# Patient Record
Sex: Female | Born: 1990 | Race: White | Hispanic: No | Marital: Single | Smoking: Current some day smoker
Health system: Southern US, Community
[De-identification: ages and names within clinical notes are randomized; demographics above are authoritative.]

## PROBLEM LIST (undated history)

## (undated) DIAGNOSIS — L409 Psoriasis, unspecified: Secondary | ICD-10-CM

---
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
## 2013-10-11 ENCOUNTER — Emergency Department (INDEPENDENT_AMBULATORY_CARE_PROVIDER_SITE_OTHER): Payer: Commercial Managed Care - PPO

## 2013-10-11 ENCOUNTER — Emergency Department (HOSPITAL_COMMUNITY)
Admission: EM | Admit: 2013-10-11 | Discharge: 2013-10-11 | Disposition: A | Discharge status: Home / Self Care | Payer: Commercial Managed Care - PPO | Source: Home / Self Care | Attending: Emergency Medicine | Admitting: Emergency Medicine

## 2013-10-11 ENCOUNTER — Encounter (HOSPITAL_COMMUNITY): Payer: Self-pay | Admitting: Emergency Medicine

## 2013-10-11 DIAGNOSIS — W108XXA Fall (on) (from) other stairs and steps, initial encounter: Secondary | ICD-10-CM

## 2013-10-11 DIAGNOSIS — Y929 Unspecified place or not applicable: Secondary | ICD-10-CM

## 2013-10-11 DIAGNOSIS — X58XXXA Exposure to other specified factors, initial encounter: Secondary | ICD-10-CM

## 2013-10-11 DIAGNOSIS — S93409A Sprain of unspecified ligament of unspecified ankle, initial encounter: Secondary | ICD-10-CM

## 2013-10-11 MED ORDER — HYDROCODONE-ACETAMINOPHEN 5-325 MG PO TABS: 1.0000 | ORAL_TABLET | ORAL | Status: AC | PRN

## 2013-10-11 MED ORDER — NAPROXEN 500 MG PO TABS: 500.0000 mg | ORAL_TABLET | Freq: Two times a day (BID) | ORAL | Status: AC

## 2013-10-11 NOTE — Discharge Instructions (Signed)
Acute Ankle Sprain  with Phase I Rehab  An acute ankle sprain is a partial or complete tear in one or more of the ligaments of the ankle due to traumatic injury. The severity of the injury depends on both the the number of ligaments sprained and the grade of sprain. There are 3 grades of sprains.   · A grade 1 sprain is a mild sprain. There is a slight pull without obvious tearing. There is no loss of strength, and the muscle and ligament are the correct length.  · A grade 2 sprain is a moderate sprain. There is tearing of fibers within the substance of the ligament where it connects two bones or two cartilages. The length of the ligament is increased, and there is usually decreased strength.  · A grade 3 sprain is a complete rupture of the ligament and is uncommon.  In addition to the grade of sprain, there are three types of ankle sprains.   Lateral ankle sprains: This is a sprain of one or more of the three ligaments on the outer side (lateral) of the ankle. These are the most common sprains.  Medial ankle sprains: There is one large triangular ligament of the inner side (medial) of the ankle that is susceptible to injury. Medial ankle sprains are less common.  Syndesmosis, "high ankle," sprains: The syndesmosis is the ligament that connects the two bones of the lower leg. Syndesmosis sprains usually only occur with very severe ankle sprains.  SYMPTOMS  · Pain, tenderness, and swelling in the ankle, starting at the side of injury that may progress to the whole ankle and foot with time.  · "Pop" or tearing sensation at the time of injury.  · Bruising that may spread to the heel.  · Impaired ability to walk soon after injury.  CAUSES   · Acute ankle sprains are caused by trauma placed on the ankle that temporarily forces or pries the anklebone (talus) out of its normal socket.  · Stretching or tearing of the ligaments that normally hold the joint in place (usually due to a twisting injury).  RISK INCREASES  WITH:  · Previous ankle sprain.  · Sports in which the foot may land awkwardly (ie. basketball, volleyball, or soccer) or walking or running on uneven or rough surfaces.  · Shoes with inadequate support to prevent sideways motion when stress occurs.  · Poor strength and flexibility.  · Poor balance skills.  · Contact sports.  PREVENTION   · Warm up and stretch properly before activity.  · Maintain physical fitness:  · Ankle and leg flexibility, muscle strength, and endurance.  · Cardiovascular fitness.  · Balance training activities.  · Use proper technique and have a coach correct improper technique.  · Taping, protective strapping, bracing, or high-top tennis shoes may help prevent injury. Initially, tape is best; however, it loses most of its support function within 10 to 15 minutes.  · Wear proper fitted protective shoes (High-top shoes with taping or bracing is more effective than either alone).  · Provide the ankle with support during sports and practice activities for 12 months following injury.  PROGNOSIS   · If treated properly, ankle sprains can be expected to recover completely; however, the length of recovery depends on the degree of injury.  · A grade 1 sprain usually heals enough in 5 to 7 days to allow modified activity and requires an average of 6 weeks to heal completely.  · A grade 2 sprain requires   6 to 10 weeks to heal completely.  · A grade 3 sprain requires 12 to 16 weeks to heal.  · A syndesmosis sprain often takes more than 3 months to heal.  RELATED COMPLICATIONS   · Frequent recurrence of symptoms may result in a chronic problem. Appropriately addressing the problem the first time decreases the frequency of recurrence and optimizes healing time. Severity of the initial sprain does not predict the likelihood of later instability.  · Injury to other structures (bone, cartilage, or tendon).  · A chronically unstable or arthritic ankle joint is a possiblity with repeated  sprains.  TREATMENT  Treatment initially involves the use of ice, medication, and compression bandages to help reduce pain and inflammation. Ankle sprains are usually immobilized in a walking cast or boot to allow for healing. Crutches may be recommended to reduce pressure on the injury. After immobilization, strengthening and stretching exercises may be necessary to regain strength and a full range of motion. Surgery is rarely needed to treat ankle sprains.  MEDICATION   · Nonsteroidal anti-inflammatory medications, such as aspirin and ibuprofen (do not take for the first 3 days after injury or within 7 days before surgery), or other minor pain relievers, such as acetaminophen, are often recommended. Take these as directed by your caregiver. Contact your caregiver immediately if any bleeding, stomach upset, or signs of an allergic reaction occur from these medications.  · Ointments applied to the skin may be helpful.  · Pain relievers may be prescribed as necessary by your caregiver. Do not take prescription pain medication for longer than 4 to 7 days. Use only as directed and only as much as you need.  HEAT AND COLD  · Cold treatment (icing) is used to relieve pain and reduce inflammation for acute and chronic cases. Cold should be applied for 10 to 15 minutes every 2 to 3 hours for inflammation and pain and immediately after any activity that aggravates your symptoms. Use ice packs or an ice massage.  · Heat treatment may be used before performing stretching and strengthening activities prescribed by your caregiver. Use a heat pack or a warm soak.  SEEK IMMEDIATE MEDICAL CARE IF:   · Pain, swelling, or bruising worsens despite treatment.  · You experience pain, numbness, discoloration, or coldness in the foot or toes.  · New, unexplained symptoms develop (drugs used in treatment may produce side effects.)  EXERCISES   PHASE I EXERCISES  RANGE OF MOTION (ROM) AND STRETCHING EXERCISES - Ankle Sprain, Acute Phase I,  Weeks 1 to 2  These exercises may help you when beginning to restore flexibility in your ankle. You will likely work on these exercises for the 1 to 2 weeks after your injury. Once your physician, physical therapist, or athletic trainer sees adequate progress, he or she will advance your exercises. While completing these exercises, remember:   · Restoring tissue flexibility helps normal motion to return to the joints. This allows healthier, less painful movement and activity.  · An effective stretch should be held for at least 30 seconds.  · A stretch should never be painful. You should only feel a gentle lengthening or release in the stretched tissue.  RANGE OF MOTION - Dorsi/Plantar Flexion  · While sitting with your right / left knee straight, draw the top of your foot upwards by flexing your ankle. Then reverse the motion, pointing your toes downward.  · Hold each position for __________ seconds.  · After completing your first set of   exercises, repeat this exercise with your knee bent.  Repeat __________ times. Complete this exercise __________ times per day.   RANGE OF MOTION - Ankle Alphabet  · Imagine your right / left big toe is a pen.  · Keeping your hip and knee still, write out the entire alphabet with your "pen." Make the letters as large as you can without increasing any discomfort.  Repeat __________ times. Complete this exercise __________ times per day.   STRENGTHENING EXERCISES - Ankle Sprain, Acute -Phase I, Weeks 1 to 2  These exercises may help you when beginning to restore strength in your ankle. You will likely work on these exercises for 1 to 2 weeks after your injury. Once your physician, physical therapist, or athletic trainer sees adequate progress, he or she will advance your exercises. While completing these exercises, remember:   · Muscles can gain both the endurance and the strength needed for everyday activities through controlled exercises.  · Complete these exercises as instructed by  your physician, physical therapist, or athletic trainer. Progress the resistance and repetitions only as guided.  · You may experience muscle soreness or fatigue, but the pain or discomfort you are trying to eliminate should never worsen during these exercises. If this pain does worsen, stop and make certain you are following the directions exactly. If the pain is still present after adjustments, discontinue the exercise until you can discuss the trouble with your clinician.  STRENGTH - Dorsiflexors  · Secure a rubber exercise band/tubing to a fixed object (ie. table, pole) and loop the other end around your right / left foot.  · Sit on the floor facing the fixed object. The band/tubing should be slightly tense when your foot is relaxed.  · Slowly draw your foot back toward you using your ankle and toes.  · Hold this position for __________ seconds. Slowly release the tension in the band and return your foot to the starting position.  Repeat __________ times. Complete this exercise __________ times per day.   STRENGTH - Plantar-flexors   · Sit with your right / left leg extended. Holding onto both ends of a rubber exercise band/tubing, loop it around the ball of your foot. Keep a slight tension in the band.  · Slowly push your toes away from you, pointing them downward.  · Hold this position for __________ seconds. Return slowly, controlling the tension in the band/tubing.  Repeat __________ times. Complete this exercise __________ times per day.   STRENGTH - Ankle Eversion  · Secure one end of a rubber exercise band/tubing to a fixed object (table, pole). Loop the other end around your foot just before your toes.  · Place your fists between your knees. This will focus your strengthening at your ankle.  · Drawing the band/tubing across your opposite foot, slowly, pull your little toe out and up. Make sure the band/tubing is positioned to resist the entire motion.  · Hold this position for __________ seconds.  Have  your muscles resist the band/tubing as it slowly pulls your foot back to the starting position.   Repeat __________ times. Complete this exercise __________ times per day.   STRENGTH - Ankle Inversion  · Secure one end of a rubber exercise band/tubing to a fixed object (table, pole). Loop the other end around your foot just before your toes.  · Place your fists between your knees. This will focus your strengthening at your ankle.  · Slowly, pull your big toe up and in, making   sure the band/tubing is positioned to resist the entire motion.  · Hold this position for __________ seconds.  · Have your muscles resist the band/tubing as it slowly pulls your foot back to the starting position.  Repeat __________ times. Complete this exercises __________ times per day.   STRENGTH - Towel Curls  · Sit in a chair positioned on a non-carpeted surface.  · Place your right / left foot on a towel, keeping your heel on the floor.  · Pull the towel toward your heel by only curling your toes. Keep your heel on the floor.  · If instructed by your physician, physical therapist, or athletic trainer, add weight to the end of the towel.  Repeat __________ times. Complete this exercise __________ times per day.  Document Released: 01/16/2005 Document Revised: 09/09/2011 Document Reviewed: 09/29/2008  [employer]® Patient Information ©2014 [employer].    Acute Ankle Sprain  with Phase II Rehab  An acute ankle sprain is a partial or complete tear in one or more of the ligaments of the ankle due to traumatic injury. The severity of the injury depends on both the number of ligaments sprained and the grade of sprain. There are 3 grades of sprains.  · A grade 1 sprain is a mild sprain. There is a slight pull without obvious tearing. There is no loss of strength, and the muscle and ligament are the correct length.  · A grade 2 sprain is a moderate sprain. There is tearing of fibers within the substance of the ligament where it connects two bones  or two cartilages. The length of the ligament is increased, and there is usually decreased strength.  · A grade 3 sprain is a complete rupture of the ligament and is uncommon.  In addition to the grade of sprain, there are 3 types of ankle sprains.   Lateral ankle sprains. This is a sprain of one or more of the 3 ligaments on the outer side (lateral) of the ankle. These are the most common sprains.  Medial ankle sprains. There is one large triangular ligament on the inner side (medial) of the ankle that is susceptible to injury. Medial ankle sprains are less common.  Syndesmosis, "high ankle," sprains. The syndesmosis is the ligament that connects the two bones of the lower leg. Syndesmosis sprains usually only occur with very severe ankle sprains.  SYMPTOMS  · Pain, tenderness, and swelling in the ankle, starting at the side of injury that may progress to the whole ankle and foot with time.  · "Pop" or tearing sensation at the time of injury.  · Bruising that may spread to the heel.  · Impaired ability to walk soon after injury.  CAUSES   · Acute ankle sprains are caused by trauma placed on the ankle that temporarily forces or pries the anklebone (talus) out of its normal socket.  · Stretching or tearing of the ligaments that normally hold the joint in place (usually due to a twisting injury).  RISK INCREASES WITH:  · Previous ankle sprain.  · Sports in which the foot may land awkwardly (basketball, volleyball, soccer) or walking or running on uneven or rough surfaces.  · Shoes with inadequate support to prevent sideways motion when stress occurs.  · Poor strength and flexibility.  · Poor balance skills.  · Contact sports.  PREVENTION  · Warm up and stretch properly before activity.  · Maintain physical fitness:  · Ankle and leg flexibility, muscle strength, and endurance.  · Cardiovascular   fitness.  · Balance training activities.  · Use proper technique and have a coach correct improper technique.  · Taping,  protective strapping, bracing, or high-top tennis shoes may help prevent injury. Initially, tape is best. However, it loses most of its support function within 10 to 15 minutes.  · Wear proper fitted protective shoes. Combining high-top shoes with taping or bracing is more effective than using either alone.  · Provide the ankle with support during sports and practice activities for 12 months following injury.  PROGNOSIS   · If treated properly, ankle sprains can be expected to recover completely. However, the length of recovery depends on the degree of injury.  · A grade 1 sprain usually heals enough in 5 to 7 days to allow modified activity and requires an average of 6 weeks to heal completely.  · A grade 2 sprain requires 6 to 10 weeks to heal completely.  · A grade 3 sprain requires 12 to 16 weeks to heal.  · A syndesmosis sprain often takes more than 3 months to heal.  RELATED COMPLICATIONS   · Frequent recurrence of symptoms may result in a chronic problem. Appropriately addressing the problem the first time decreases the frequency of recurrence and optimizes healing time. Severity of initial sprain does not predict the likelihood of later instability.  · Injury to other structures (bone, cartilage, or tendon).  · Chronically unstable or arthritic ankle joint are possible with repeated sprains.  TREATMENT  Treatment initially involves the use of ice, medicine, and compression bandages to help reduce pain and inflammation. Ankle sprains are usually immobilized in a walking cast or boot to allow for healing. Crutches may be recommended to reduce pressure on the injury. After immobilization, strengthening and stretching exercises may be necessary to regain strength and a full range of motion. Surgery is rarely needed to treat ankle sprains.  MEDICATION   · Nonsteroidal anti-inflammatory medicines, such as aspirin and ibuprofen (do not take for the first 3 days after injury or within 7 days before surgery), or  other minor pain relievers, such as acetaminophen, are often recommended. Take these as directed by your caregiver. Contact your caregiver immediately if any bleeding, stomach upset, or signs of an allergic reaction occur from these medicines.  · Ointments applied to the skin may be helpful.  · Pain relievers may be prescribed as necessary by your caregiver. Do not take prescription pain medicine for longer than 4 to 7 days. Use only as directed and only as much as you need.  HEAT AND COLD  · Cold treatment (icing) is used to relieve pain and reduce inflammation for acute and chronic cases. Cold should be applied for 10 to 15 minutes every 2 to 3 hours for inflammation and pain and immediately after any activity that aggravates your symptoms. Use ice packs or an ice massage.  · Heat treatment may be used before performing stretching and strengthening activities prescribed by your caregiver. Use a heat pack or a warm soak.  SEEK IMMEDIATE MEDICAL CARE IF:   · Pain, swelling, or bruising worsens despite treatment.  · You experience pain, numbness, discoloration, or coldness in the foot or toes.  · New, unexplained symptoms develop. (Drugs used in treatment may produce side effects.)  EXERCISES   PHASE II EXERCISES  RANGE OF MOTION (ROM) AND STRETCHING EXERCISES - Ankle Sprain, Acute-Phase II, Weeks 3 to 4  After your physician, physical therapist, or athletic trainer feels your knee has made progress significant enough   to begin more advanced exercises, he or she may recommend completing some of the following exercises. Although each person heals at different rates, most people will be ready for these exercises between 3 and 4 weeks after their injury. Do not begin these exercises until you have your caregiver's permission. He or she may also advise you to continue with the exercises which you completed in Phase I of your rehabilitation. While completing these exercises, remember:   · Restoring tissue flexibility helps  normal motion to return to the joints. This allows healthier, less painful movement and activity.  · An effective stretch should be held for at least 30 seconds.  · A stretch should never be painful. You should only feel a gentle lengthening or release in the stretched tissue.  RANGE OF MOTION - Ankle Plantar Flexion   · Sit with your right / left leg crossed over your opposite knee.  · Use your opposite hand to pull the top of your foot and toes toward you.  · You should feel a gentle stretch on the top of your foot/ankle. Hold this position for __________.  Repeat __________ times. Complete __________ times per day.   RANGE OF MOTION - Ankle Eversion  · Sit with your right / left ankle crossed over your opposite knee.  · Grip your foot with your opposite hand, placing your thumb on the top of your foot and your fingers across the bottom of your foot.  · Gently push your foot downward with a slight rotation so your littlest toes rise slightly  · You should feel a gentle stretch on the inside of your ankle. Hold the stretch for __________ seconds.  Repeat __________ times. Complete this exercise __________ times per day.   RANGE OF MOTION - Ankle Inversion  · Sit with your right / left ankle crossed over your opposite knee.  · Grip your foot with your opposite hand, placing your thumb on the bottom of your foot and your fingers across the top of your foot.  · Gently pull your foot so the smallest toe comes toward you and your thumb pushes the inside of the ball of your foot away from you.  · You should feel a gentle stretch on the outside of your ankle. Hold the stretch for __________ seconds.  Repeat __________ times. Complete this exercise __________ times per day.   STRETCH - Gastrocsoleus  · Sit with your right / left leg extended. Holding onto both ends of a belt or towel, loop it around the ball of your foot.  · Keeping your right / left ankle and foot relaxed and your knee straight, pull your foot and ankle  toward you using the belt/towel.  · You should feel a gentle stretch behind your calf or knee. Hold this position for __________ seconds.  Repeat __________ times. Complete this stretch __________ times per day.   RANGE OF MOTION - Ankle Dorsiflexion, Active Assisted  · Remove shoes and sit on a chair that is preferably not on a carpeted surface.  · Place right / left foot under knee. Extend your opposite leg for support.  · Keeping your heel down, slide your right / left foot back toward the chair until you feel a stretch at your ankle or calf. If you do not feel a stretch, slide your bottom forward to the edge of the chair while still keeping your heel down.  · Hold this stretch for __________ seconds.  Repeat __________ times. Complete this stretch   __________ times per day.   STRETCH  Gastroc, Standing   · Place hands on wall.  · Extend right / left leg and place a folded washcloth under the arch of your foot for support. Keep the front knee somewhat bent.  · Slightly point your toes inward on your back foot.  · Keeping your right / left heel on the floor and your knee straight, shift your weight toward the wall, not allowing your back to arch.  · You should feel a gentle stretch in the calf. Hold this position for __________ seconds.  Repeat __________ times. Complete this stretch __________ times per day.  STRETCH  Soleus, Standing  · Place hands on wall.  · Extend right / left leg and place a folded washcloth under the arch of your foot for support. Keep the front knee somewhat bent.  · Slightly point your toes inward on your back foot.  · Keep your right / left heel on the floor, bend your back knee, and slightly shift your weight over the back leg so that you feel a gentle stretch deep in your back calf.  · Hold this position for __________ seconds.  Repeat __________ times. Complete this stretch __________ times per day.  STRETCH  Gastrocsoleus, Standing  Note: This exercise can place a lot of stress on  your foot and ankle. Please complete this exercise only if specifically instructed by your caregiver.   · Place the ball of your right / left foot on a step, keeping your other foot firmly on the same step.  · Hold on to the wall or a rail for balance.  · Slowly lift your other foot, allowing your body weight to press your heel down over the edge of the step.  · You should feel a stretch in your right / left calf.  · Hold this position for __________ seconds.  · Repeat this exercise with a slight bend in your knee.  Repeat __________ times. Complete this stretch __________ times per day.   STRENGTHENING EXERCISES - Ankle Sprain, Acute-Phase II  Around 3 to 4 weeks after your injury, you may progress to some of these exercises in your rehabilitation program. Do not begin these until you have your caregiver's permission. Although your condition has improved, the Phase I exercises will continue to be helpful and you may continue to complete them. As you complete strengthening exercises, remember:   · Strong muscles with good endurance tolerate stress better.  · Do the exercises as initially prescribed by your caregiver. Progress slowly with each exercise, gradually increasing the number of repetitions and weight used under his or her guidance.  · You may experience muscle soreness or fatigue, but the pain or discomfort you are trying to eliminate should never worsen during these exercises. If this pain does worsen, stop and make certain you are following the directions exactly. If the pain is still present after adjustments, discontinue the exercise until you can discuss the trouble with your caregiver.  STRENGTH - Plantar-flexors, Standing  · Stand with your feet shoulder width apart. Steady yourself with a wall or table using as little support as needed.  · Keeping your weight evenly spread over the width of your feet, rise up on your toes.*  · Hold this position for __________ seconds.  Repeat __________ times.  Complete this exercise __________ times per day.   *If this is too easy, shift your weight toward your right / left leg until you feel challenged. Ultimately,   you may be asked to do this exercise with your right / left foot only.  STRENGTH  Dorsiflexors and Plantar-flexors, Heel/toe Walking  · Dorsiflexion: Walk on your heels only. Keep your toes as high as possible.  · Walk for ____________________ seconds/feet.  · Repeat __________ times. Complete __________ times per day.  · Plantar flexion: Walk on your toes only. Keep your heels as high as possible.  · Walk for ____________________ seconds/feet.  Repeat __________ times. Complete __________ times per day.   BALANCE  Tandem Walking  · Place your uninjured foot on a line 2 to 4 inches wide and at least 10 feet long.  · Keeping your balance without using anything for extra support, place your right / left heel directly in front of your other foot.  · Slowly raise your back foot up, lifting from the heel to the toes, and place it directly in front of the right / left foot.  · Continue to walk along the line slowly. Walk for ____________________ feet.  Repeat ____________________ times. Complete ____________________ times per day.  BALANCE - Inversion/Eversion  Use caution, these are advanced level exercises. Do not begin them until you are advised to do so.   · Create a balance board using a sturdy board about 1 ½ feet long and at 1 to 1 ½ feet wide and a 1 ½ inch diameter rod or pipe that is as long as the board's width. A copper pipe or a solid broomstick work well.  · Stand on a non-carpeted surface near a countertop or wall. Step onto the board so that your feet are hip-width apart and equally straddle the rod/pipe.  · Keeping your feet in place, complete these two exercises without shifting your upper body or hips:  · Tip the board from side-to-side. Control the movement so the board does not forcefully strike the ground. The board should silently tap the  ground.  · Tip the board side-to-side without striking the ground. Occasionally pause and maintain a steady position at various points.  · Repeat the first two exercises, but use only your right / left foot. Place your right / left foot directly over the rod/pipe.  Repeat __________ times. Complete this exercise __________ times a day.  BALANCE - Plantar/Dorsi Flexion  Use caution, these are advanced level exercises. Do not begin them until you are advised to do so.   · Create a balance board using a sturdy board about 1 ½ feet long and at 1 to 1 ½ feet wide and a 1 ½ inch diameter rod or pipe that is as long as the board's width. A copper pipe or a solid broomstick work well.  · Stand on a non-carpeted surface near a countertop or wall. Stand on the board so that the rod/pipe runs under the arches in your feet.  · Keeping your feet in place, complete these two exercises without shifting your upper body or hips:  · Tip the board from side-to-side. Control the movement so the board does not forcefully strike the ground. The board should silently tap the ground.  · Tip the board side-to-side without striking the ground. Occasionally pause and maintain a steady position at various points.  · Repeat the first two exercises, but use only your right / left foot. Stand in the center of the board.  Repeat __________ times. Complete this exercise __________ times a day.  STRENGTH  Plantar-flexors, Eccentric  Note: This exercise can place a lot of stress on

## 2013-10-11 NOTE — ED Notes (Signed)
Reports falling down steps last night around 9 p.m injuring left ankle.  Having pain with walking and swelling.  No relief with advil or elevation.

## 2013-10-11 NOTE — ED Provider Notes (Signed)
Medical screening examination/treatment/procedure(s) were performed by non-physician practitioner and as supervising physician I was immediately available for consultation/collaboration.  Leslee Homeavid Tanishi Nault, M.D.  Reuben Likesavid C Wei Poplaski, MD 10/11/13 [phone]

## 2013-10-11 NOTE — ED Provider Notes (Signed)
CSN: [id number]     Arrival date & time 10/11/13  1058 History   First MD Initiated Contact with Patient 10/11/13 1237     Chief Complaint  Patient presents with  . Ankle Injury   (Consider location/radiation/quality/duration/timing/severity/associated sxs/prior Treatment) HPI Comments: 23 year old female presents complaining of left ankle injury. At 9 PM last night, she fell down some steps. She had immediate pain and swelling in the ankle. She has pain from the mid calf all the way down through the ankle in the foot. She also has subjective numbness in the foot. No other injuries. No previous history of ankle injuries.  Patient is a 23 y.o. female presenting with lower extremity injury.  Ankle Injury    Past Medical History  Diagnosis Date  . Asthma    History reviewed. No pertinent past surgical history. History reviewed. No pertinent family history. History  Substance Use Topics  . Smoking status: Never Smoker   . Smokeless tobacco: Never Used  . Alcohol Use: Yes   OB History   Grav Para Term Preterm Abortions TAB SAB Ect Mult Living                 Review of Systems  Musculoskeletal: Positive for arthralgias, gait problem and joint swelling.  Neurological: Negative for numbness.  All other systems reviewed and are negative.   Allergies  Review of patient's allergies indicates no known allergies.  Home Medications   Current Outpatient Rx  Name  Route  Sig  Dispense  Refill  . ADVAIR DISKUS 250-50 MCG/DOSE AEPB      inhale 1 puff by mouth twice a day   60 each   0     Pt needs OV for further refills   . HYDROcodone-acetaminophen (NORCO) 5-325 MG per tablet   Oral   Take 1 tablet by mouth every 4 (four) hours as needed for moderate pain.   20 tablet   0   . naproxen (NAPROSYN) 500 MG tablet   Oral   Take 1 tablet (500 mg total) by mouth 2 (two) times daily.   60 tablet   0    BP 131/76  Pulse 80  Temp(Src) 97.6 F (36.4 C) (Oral)  Resp 16  SpO2  100%  LMP 09/27/2013 Physical Exam  Nursing note and vitals reviewed. Constitutional: She is oriented to person, place, and time. Vital signs are normal. She appears well-developed and well-nourished. No distress.  HENT:  Head: Normocephalic and atraumatic.  Cardiovascular:  Capillary refill is less than 3 seconds in the bilateral lower extremities  Pulmonary/Chest: Effort normal. No respiratory distress.  Musculoskeletal:       Left ankle: She exhibits decreased range of motion, swelling (lateral) and abnormal pulse (Is decreased but was able to find with Doppler, but this is equal bilaterally). She exhibits no ecchymosis. Tenderness. Lateral malleolus, medial malleolus, AITFL, CF ligament and proximal fibula (Palpation of the proximal fibula causes pain into the ankle) tenderness found. No posterior TFL and no head of 5th metatarsal tenderness found. Achilles tendon normal.       Left lower leg: She exhibits tenderness (from the mid leg downward) and bony tenderness (palpation of the fibula causes pain in the ankle).       Left foot: She exhibits tenderness (Along the metatarsals).  Neurological: She is alert and oriented to person, place, and time. She has normal strength. Coordination normal.  Skin: Skin is warm and dry. No rash noted. She is not diaphoretic.  Psychiatric:  She has a normal mood and affect. Judgment normal.    ED Course  Procedures (including critical care time) Labs Review Labs Reviewed - No data to display Imaging Review Dg Tibia/fibula Left  10/11/2013   CLINICAL DATA:  Left ankle pain after fall.  EXAM: LEFT TIBIA AND FIBULA - 2 VIEW  COMPARISON:  None.  FINDINGS: There is no evidence of fracture or other focal bone lesions. Soft tissues are unremarkable.  IMPRESSION: Normal left tibia and fibula.   Electronically Signed   By: Roque LiasJames  Green M.D.   On: 10/11/2013 13:49   Dg Ankle Complete Left  10/11/2013   CLINICAL DATA:  Larey SeatFell down stairs.  Lateral ankle pain and  swelling.  EXAM: LEFT ANKLE COMPLETE - 3+ VIEW  COMPARISON:  None.  FINDINGS: There is no evidence of fracture, dislocation, or joint effusion. There is no evidence of arthropathy or other focal bone abnormality. Mild lateral soft tissue swelling noted.  IMPRESSION: Mild lateral soft tissue swelling.  No evidence of fracture.   Electronically Signed   By: Myles RosenthalJohn  Stahl M.D.   On: 10/11/2013 13:51   Dg Foot Complete Left  10/11/2013   CLINICAL DATA:  Larey SeatFell down stairs.  Lateral foot pain and swelling.  EXAM: LEFT FOOT - COMPLETE 3+ VIEW  COMPARISON:  None.  FINDINGS: There is no evidence of fracture or dislocation. There is no evidence of arthropathy or other focal bone abnormality. Soft tissues are unremarkable.  IMPRESSION: Negative.   Electronically Signed   By: Myles RosenthalJohn  Stahl M.D.   On: 10/11/2013 13:51     MDM   1. Ankle sprain    Placed in cam walker and crutches.  RICE.  Dont use cam walker for longer than 5 days, dont use crutches for longer than a week.  F/u PRN    Meds ordered this encounter  Medications  . naproxen (NAPROSYN) 500 MG tablet    Sig: Take 1 tablet (500 mg total) by mouth 2 (two) times daily.    Dispense:  60 tablet    Refill:  0    Order Specific Question:  Supervising Provider    Answer:  Lorenz CoasterKELLER, DAVID C [id number]]  . HYDROcodone-acetaminophen (NORCO) 5-325 MG per tablet    Sig: Take 1 tablet by mouth every 4 (four) hours as needed for moderate pain.    Dispense:  20 tablet    Refill:  0    Order Specific Question:  Supervising Provider    Answer:  Lorenz CoasterKELLER, DAVID C [[id number]]       Graylon GoodZachary H Thurman Sarver, PA-C 10/11/13 1426

## 2015-04-29 IMAGING — CR DG FOOT COMPLETE 3+V*L*
3 series · 3 of 3 positions shown · non-contrast
Comparison: None.

CLINICAL DATA: Fell down stairs.  Lateral foot pain and swelling.

EXAM:
LEFT FOOT - COMPLETE 3+ VIEW

[view not recorded (1 of 3)]
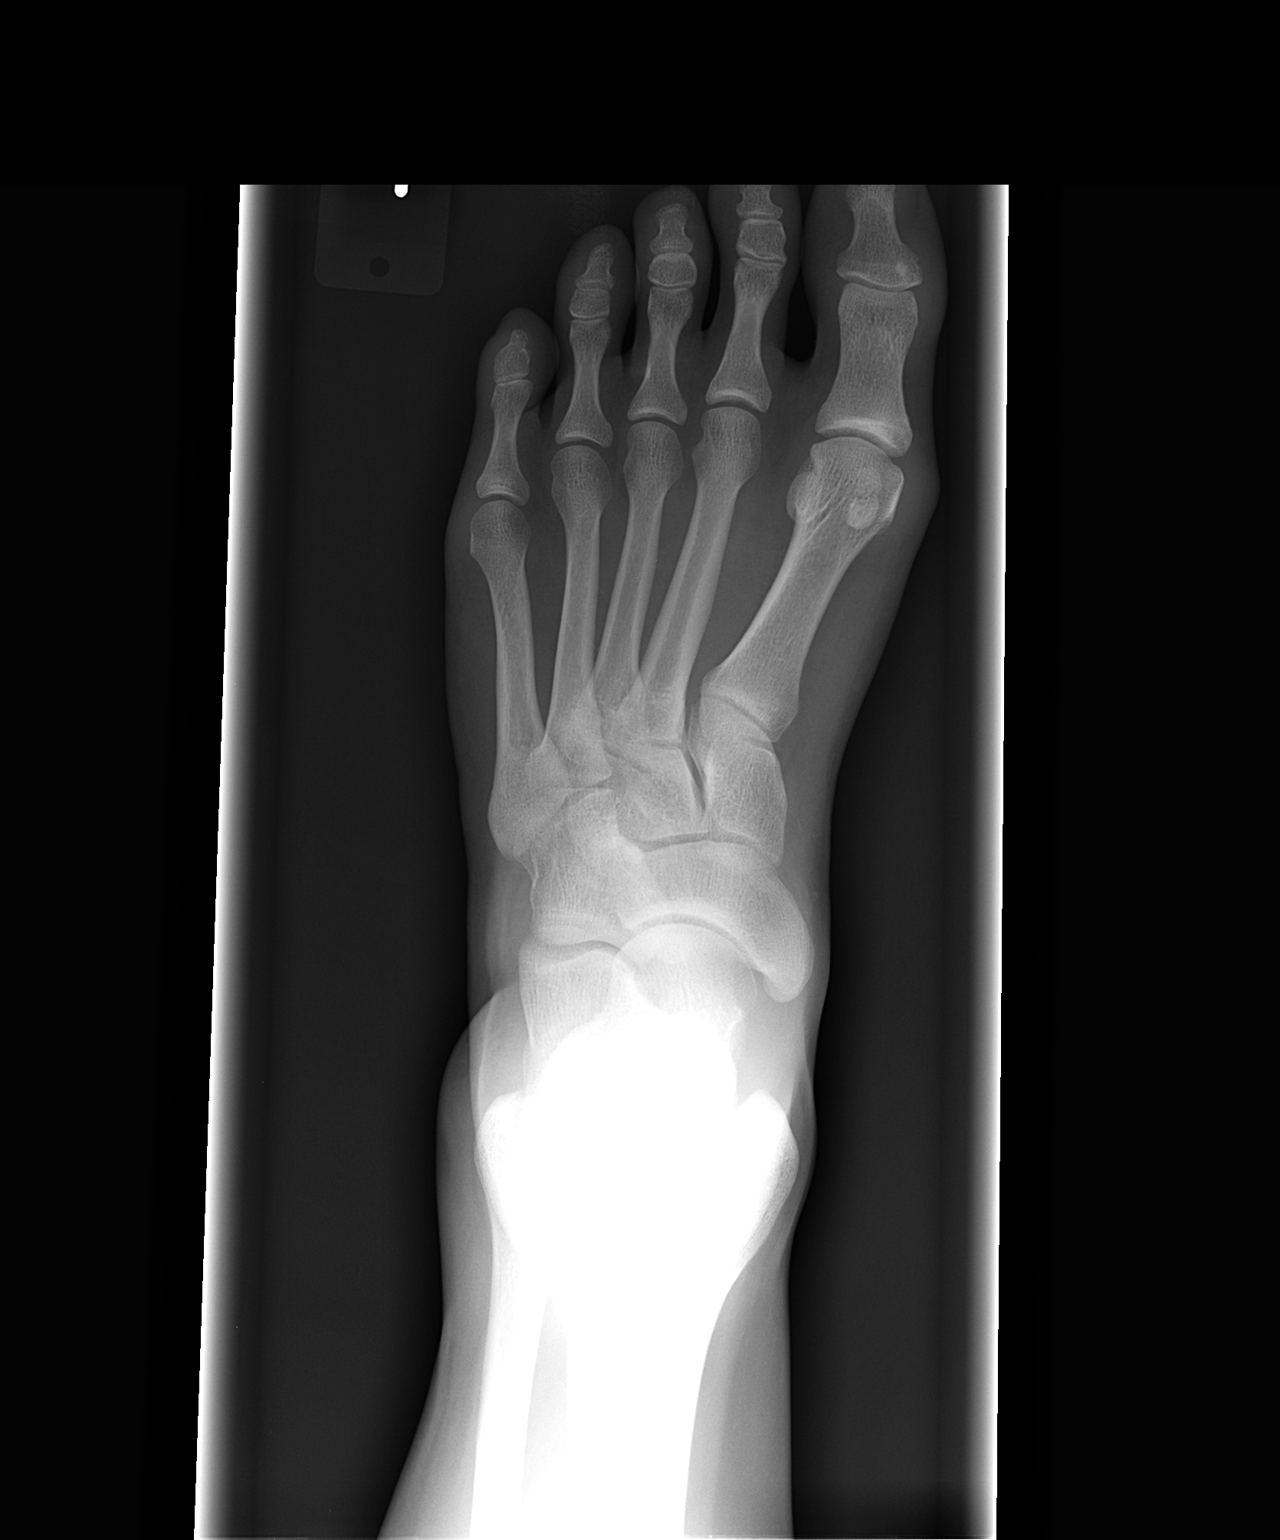

[view not recorded (2 of 3)]
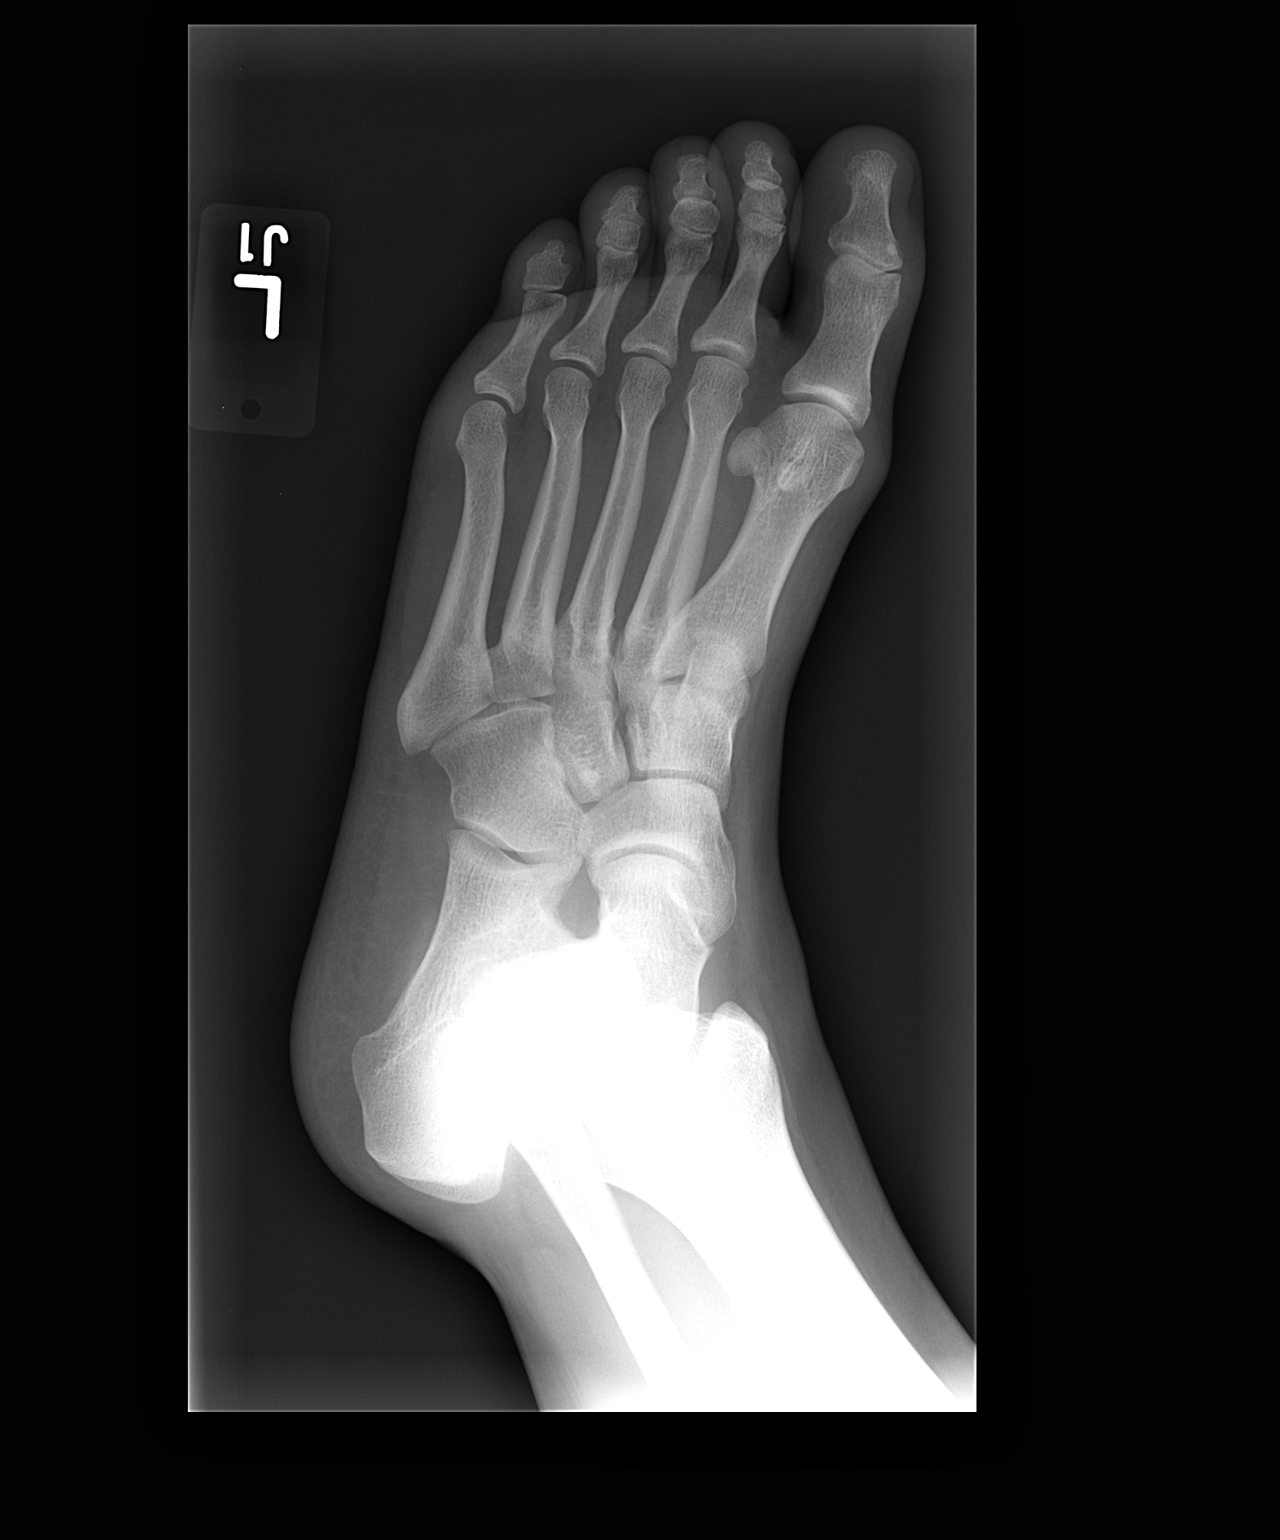

[view not recorded (3 of 3)]
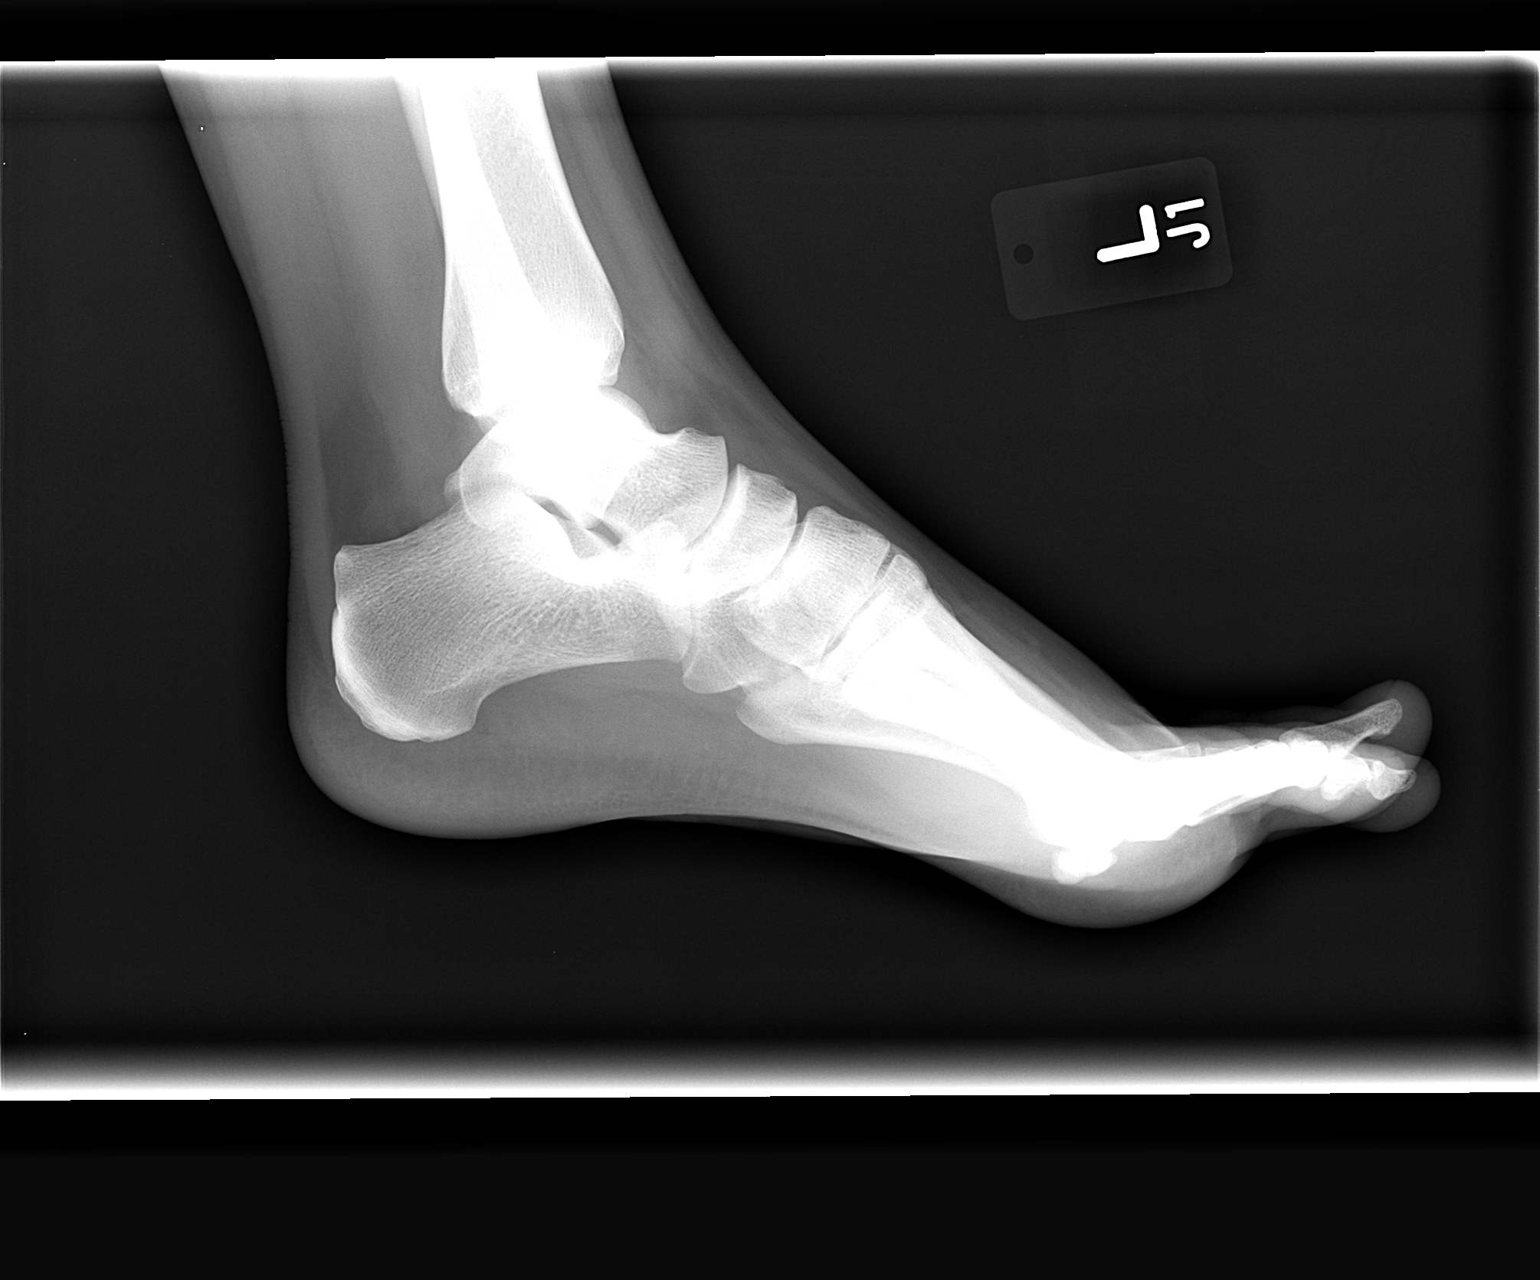

[3 of 3 positions shown; findings below may reference images not displayed]

FINDINGS: There is no evidence of fracture or dislocation. There is no
evidence of arthropathy or other focal bone abnormality. Soft
tissues are unremarkable.
IMPRESSION: Negative.

## 2015-04-29 IMAGING — CR DG ANKLE COMPLETE 3+V*L*
3 series · 3 of 3 positions shown · non-contrast
Comparison: None.

CLINICAL DATA: Fell down stairs.  Lateral ankle pain and swelling.

EXAM:
LEFT ANKLE COMPLETE - 3+ VIEW

[view not recorded (1 of 3)]
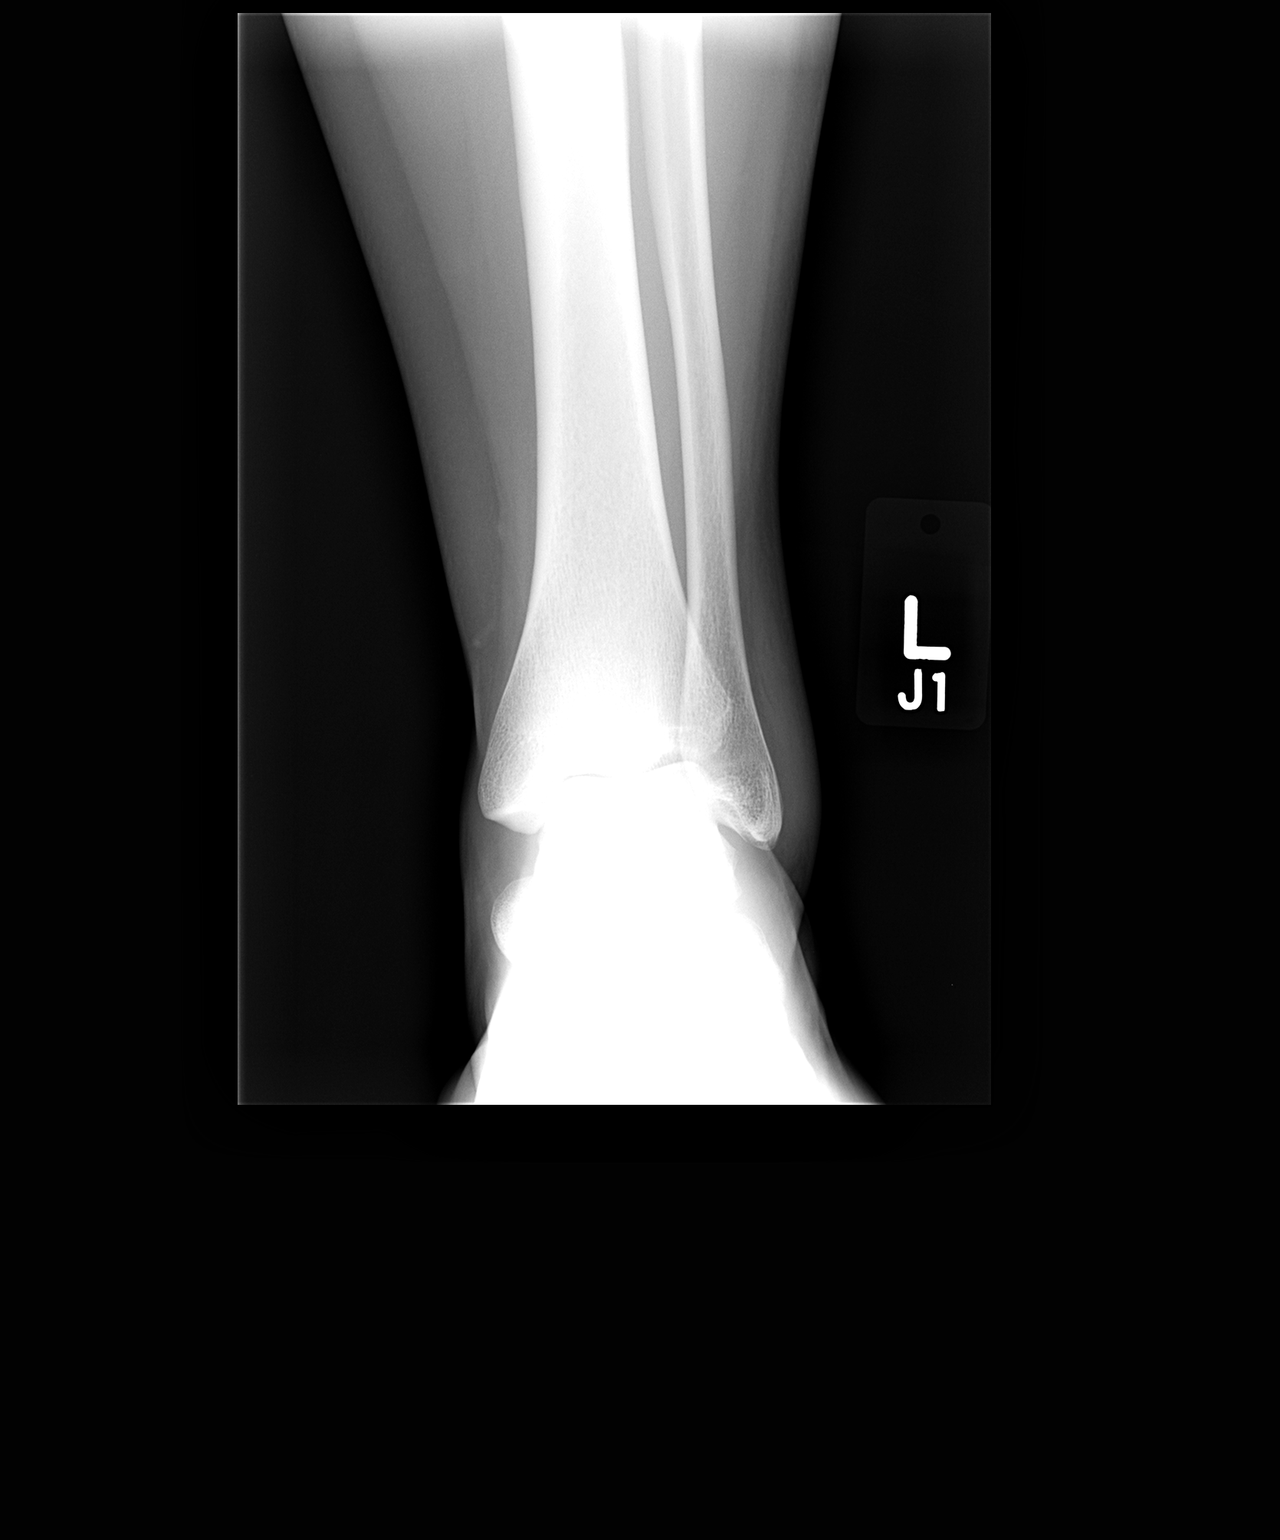

[view not recorded (2 of 3)]
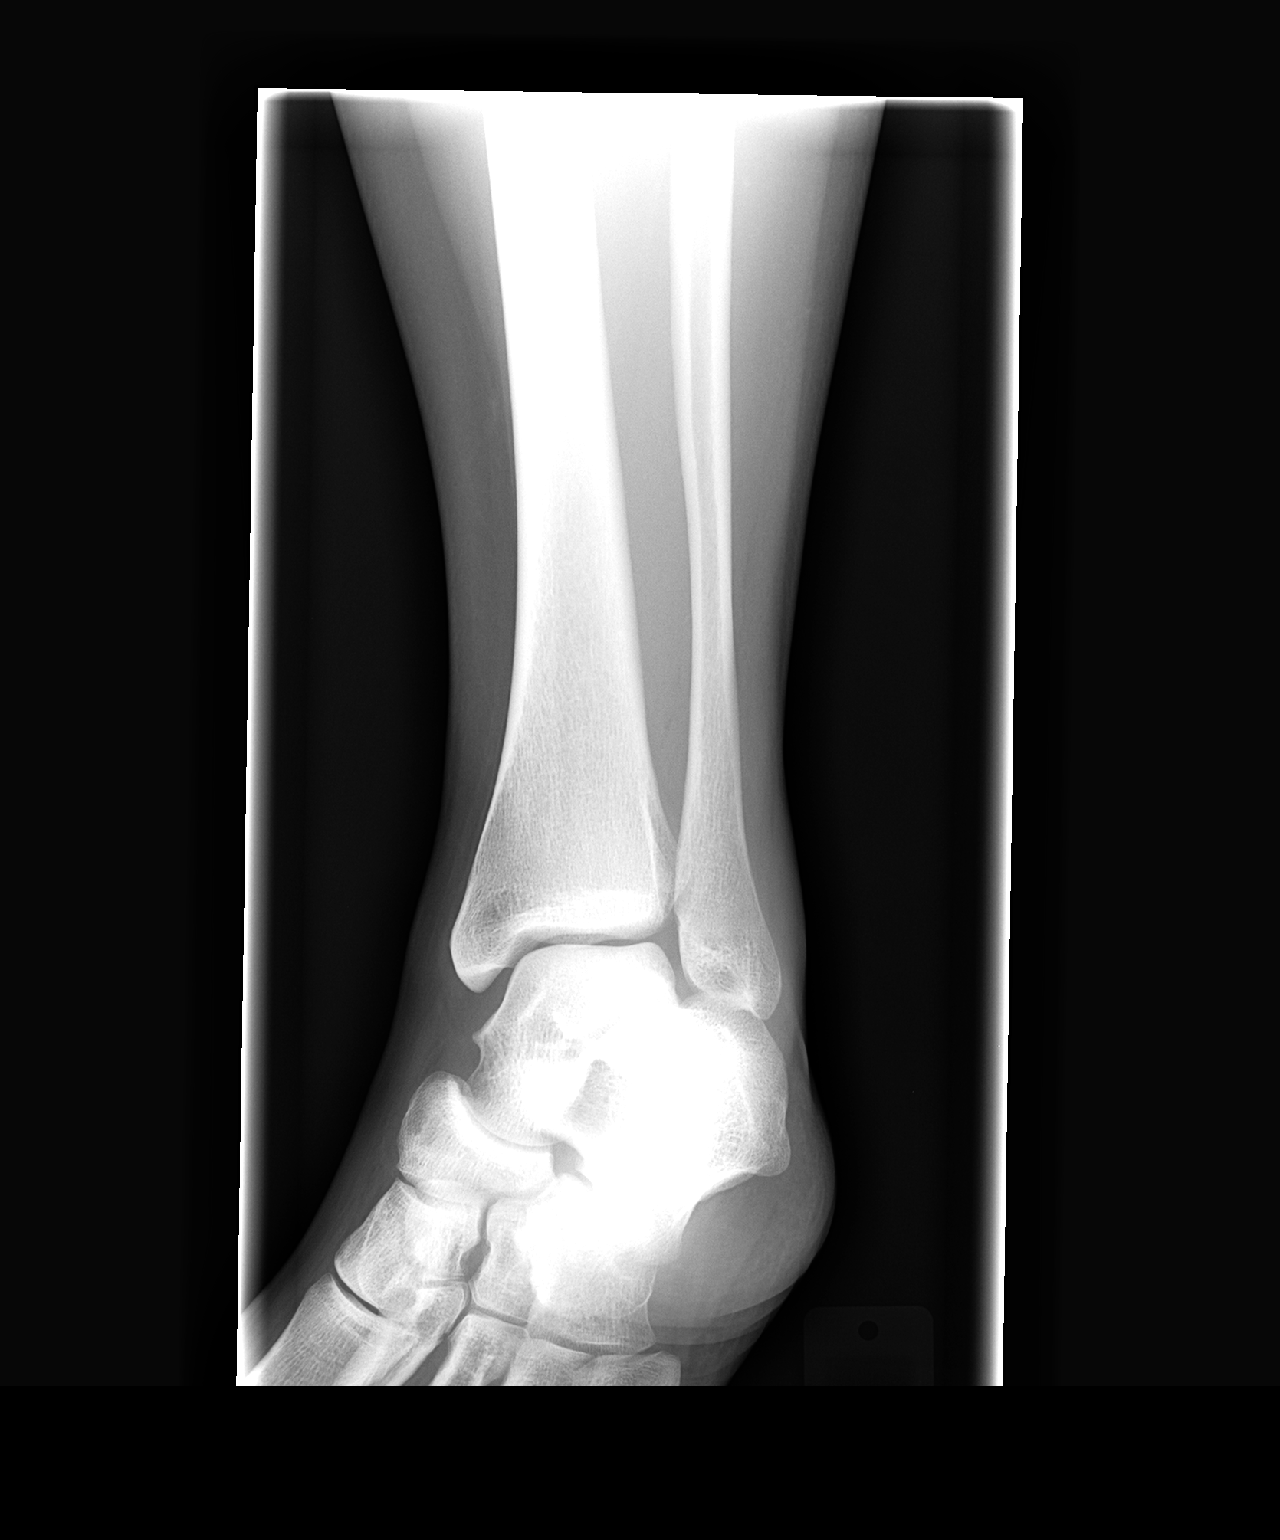

[view not recorded (3 of 3)]
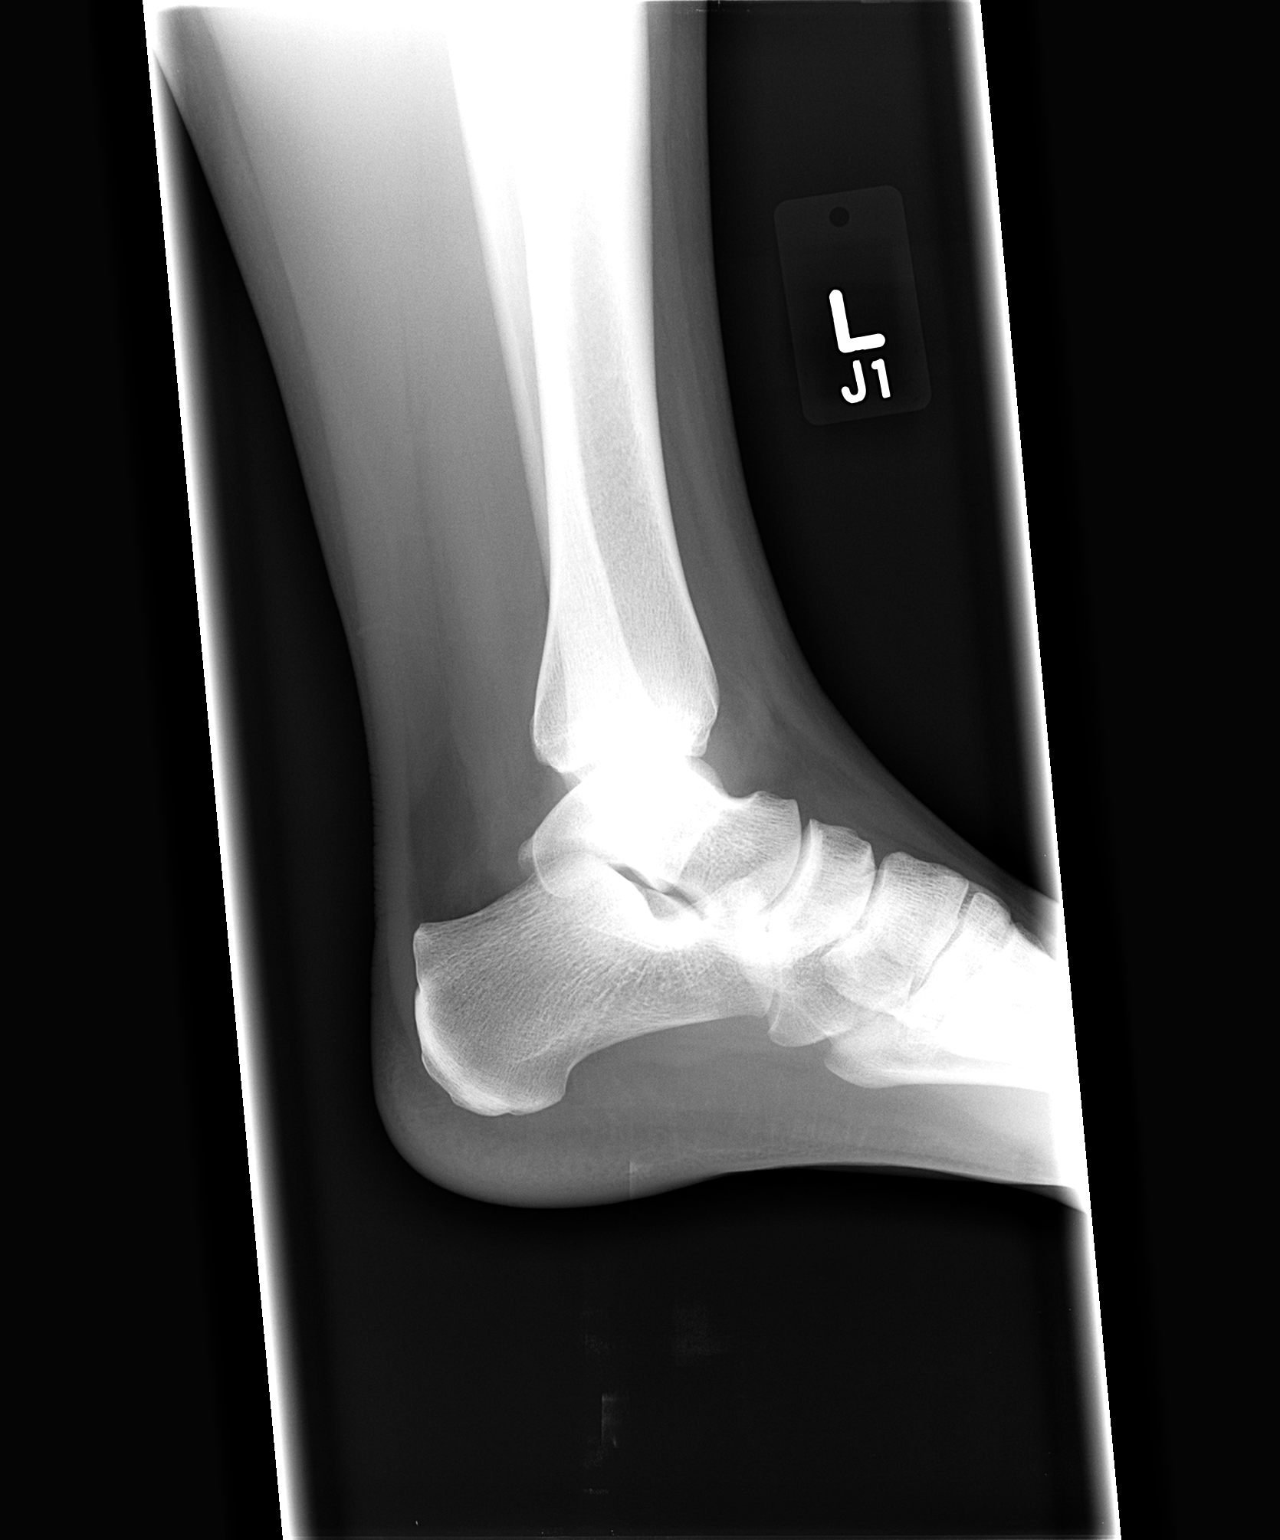

[3 of 3 positions shown; findings below may reference images not displayed]

FINDINGS: There is no evidence of fracture, dislocation, or joint effusion.
There is no evidence of arthropathy or other focal bone abnormality.
Mild lateral soft tissue swelling noted.
IMPRESSION: Mild lateral soft tissue swelling.  No evidence of fracture.

## 2023-09-30 ENCOUNTER — Emergency Department (HOSPITAL_BASED_OUTPATIENT_CLINIC_OR_DEPARTMENT_OTHER)
Admission: EM | Admit: 2023-09-30 | Discharge: 2023-09-30 | Disposition: A | Discharge status: Home / Self Care | Payer: Self-pay | Attending: Emergency Medicine | Admitting: Emergency Medicine

## 2023-09-30 ENCOUNTER — Encounter: Payer: Self-pay | Admitting: *Deleted

## 2023-09-30 ENCOUNTER — Emergency Department (HOSPITAL_BASED_OUTPATIENT_CLINIC_OR_DEPARTMENT_OTHER): Payer: Self-pay | Admitting: Radiology

## 2023-09-30 ENCOUNTER — Ambulatory Visit
Admission: EM | Admit: 2023-09-30 | Discharge: 2023-09-30 | Disposition: A | Discharge status: Home / Self Care | Payer: Self-pay

## 2023-09-30 ENCOUNTER — Encounter (HOSPITAL_BASED_OUTPATIENT_CLINIC_OR_DEPARTMENT_OTHER): Payer: Self-pay | Admitting: Emergency Medicine

## 2023-09-30 ENCOUNTER — Other Ambulatory Visit: Payer: Self-pay

## 2023-09-30 DIAGNOSIS — J069 Acute upper respiratory infection, unspecified: Secondary | ICD-10-CM

## 2023-09-30 DIAGNOSIS — R051 Acute cough: Secondary | ICD-10-CM

## 2023-09-30 DIAGNOSIS — R509 Fever, unspecified: Secondary | ICD-10-CM

## 2023-09-30 DIAGNOSIS — R0602 Shortness of breath: Secondary | ICD-10-CM

## 2023-09-30 DIAGNOSIS — J189 Pneumonia, unspecified organism: Secondary | ICD-10-CM | POA: Insufficient documentation

## 2023-09-30 HISTORY — DX: Psoriasis, unspecified: L40.9

## 2023-09-30 LAB — CBC WITH DIFFERENTIAL/PLATELET
Abs Immature Granulocytes: 0.03 10*3/uL (ref 0.00–0.07)
Basophils Absolute: 0.1 10*3/uL (ref 0.0–0.1)
Basophils Relative: 1 %
Eosinophils Absolute: 0.1 10*3/uL (ref 0.0–0.5)
Eosinophils Relative: 1 %
HCT: 42.6 % (ref 36.0–46.0)
Hemoglobin: 15 g/dL (ref 12.0–15.0)
Immature Granulocytes: 0 %
Lymphocytes Relative: 16 %
Lymphs Abs: 1.5 10*3/uL (ref 0.7–4.0)
MCH: 31.1 pg (ref 26.0–34.0)
MCHC: 35.2 g/dL (ref 30.0–36.0)
MCV: 88.2 fL (ref 80.0–100.0)
Monocytes Absolute: 0.6 10*3/uL (ref 0.1–1.0)
Monocytes Relative: 6 %
Neutro Abs: 7.3 10*3/uL (ref 1.7–7.7)
Neutrophils Relative %: 76 %
Platelets: 305 10*3/uL (ref 150–400)
RBC: 4.83 MIL/uL (ref 3.87–5.11)
RDW: 12.9 % (ref 11.5–15.5)
WBC: 9.6 10*3/uL (ref 4.0–10.5)
nRBC: 0 % (ref 0.0–0.2)

## 2023-09-30 LAB — BASIC METABOLIC PANEL WITH GFR
Anion gap: 12 (ref 5–15)
BUN: 8 mg/dL (ref 6–20)
CO2: 21 mmol/L — ABNORMAL LOW (ref 22–32)
Calcium: 9.9 mg/dL (ref 8.9–10.3)
Chloride: 102 mmol/L (ref 98–111)
Creatinine, Ser: 0.71 mg/dL (ref 0.44–1.00)
GFR, Estimated: >60 mL/min (ref >60)
Glucose, Bld: 107 mg/dL — ABNORMAL HIGH (ref 70–99)
Potassium: 3.5 mmol/L (ref 3.5–5.1)
Sodium: 135 mmol/L (ref 135–145)

## 2023-09-30 LAB — RESP PANEL BY RT-PCR (RSV, FLU A&B, COVID)  RVPGX2
Influenza A by PCR: NEGATIVE
Influenza B by PCR: NEGATIVE
Resp Syncytial Virus by PCR: NEGATIVE
SARS Coronavirus 2 by RT PCR: NEGATIVE

## 2023-09-30 MED ORDER — AZITHROMYCIN 250 MG PO TABS
500.0000 mg | ORAL_TABLET | ORAL | Status: AC
  Administered 2023-09-30: 500 mg via ORAL
  Filled 2023-09-30: qty 2

## 2023-09-30 MED ORDER — AZITHROMYCIN 250 MG PO TABS: 250.0000 mg | ORAL_TABLET | Freq: Every day | ORAL | 0 refills | Status: AC

## 2023-09-30 MED ORDER — AMOXICILLIN-POT CLAVULANATE 875-125 MG PO TABS
1.0000 | ORAL_TABLET | ORAL | Status: AC
  Administered 2023-09-30: 1 via ORAL
  Filled 2023-09-30: qty 1

## 2023-09-30 MED ORDER — LACTATED RINGERS IV BOLUS
1000.0000 mL | Freq: Once | INTRAVENOUS | Status: AC
  Administered 2023-09-30: 1000 mL via INTRAVENOUS

## 2023-09-30 MED ORDER — AMOXICILLIN-POT CLAVULANATE 875-125 MG PO TABS: 1.0000 | ORAL_TABLET | Freq: Two times a day (BID) | ORAL | 0 refills | Status: AC

## 2023-09-30 NOTE — ED Notes (Signed)
 Patient is being discharged from the Urgent Care and sent to the Emergency Department via private vehicle with family . Per Laren Everts, NP, patient is in need of higher level of care due to dyspnea, cough, fever, tachycardia. Patient is aware and verbalizes understanding of plan of care.  Vitals:   09/30/23 1804  BP: (!) 139/98  Pulse: (!) 139  Resp: 20  Temp: 99.8 F (37.7 C)  SpO2: 96%

## 2023-09-30 NOTE — ED Provider Notes (Signed)
 [facility]    CSN: [id number] Arrival date & time: 09/30/23  1755      History   Chief Complaint Chief Complaint  Patient presents with   Cough   Fever    HPI Diamond Fuentes is a 33 y.o. female.   Patient presents with approximately 9-day history of bodyaches, cough, nasal congestion.  Reports fever returned for 1 day and Tmax at home was 101.  Patient is also complaining of shortness of breath.  Patient reports history of asthma.  Does not have inhaler or nebulizer treatments at home.  Has been using Advair.  Has also taken over-the-counter cold and flu medications.  Denies any known sick contacts.  Patient's heart rate is elevated but reports that they are anxious.   Cough Fever   Past Medical History:  Diagnosis Date   Asthma    Psoriasis     Patient Active Problem List   Diagnosis Date Noted   Asthma 06/19/2010   Allergic rhinitis 05/14/2010   DYSPNEA 05/14/2010    History reviewed. No pertinent surgical history.  OB History   No obstetric history on file.      Home Medications    Prior to Admission medications   Medication Sig Start Date End Date Taking? Authorizing Provider  ADVAIR DISKUS 250-50 MCG/DOSE AEPB inhale 1 puff by mouth twice a day 02/12/11  Yes Coralyn Helling, MD  HYDROcodone-acetaminophen (NORCO) 5-325 MG per tablet Take 1 tablet by mouth every 4 (four) hours as needed for moderate pain. 10/11/13   Excell Seltzer, Adrian Blackwater, PA-C  naproxen (NAPROSYN) 500 MG tablet Take 1 tablet (500 mg total) by mouth 2 (two) times daily. 10/11/13   Graylon Good, PA-C    Family History History reviewed. No pertinent family history.  Social History Social History   Tobacco Use   Smoking status: Some Days    Types: Cigarettes   Smokeless tobacco: Never  Vaping Use   Vaping status: Some Days  Substance Use Topics   Alcohol use: Yes    Comment: "a lot", approx 2x/wk "but not in moderation"   Drug use: Yes    Types: Marijuana     Allergies    Patient has no known allergies.   Review of Systems Review of Systems Per HPI  Physical Exam Triage Vital Signs ED Triage Vitals  Encounter Vitals Group     BP 09/30/23 1804 (!) 139/98     Systolic BP Percentile --      Diastolic BP Percentile --      Pulse Rate 09/30/23 1804 (!) 139     Resp 09/30/23 1804 20     Temp 09/30/23 1804 99.8 F (37.7 C)     Temp Source 09/30/23 1804 Oral     SpO2 09/30/23 1804 96 %     Weight --      Height --      Head Circumference --      Peak Flow --      Pain Score 09/30/23 1805 2     Pain Loc --      Pain Education --      Exclude from Growth Chart --    No data found.  Updated Vital Signs BP (!) 139/98   Pulse (!) 139   Temp 99.8 F (37.7 C) (Oral)   Resp 20   LMP 09/21/2023 (Exact Date)   SpO2 96%   Visual Acuity Right Eye Distance:   Left Eye Distance:   Bilateral Distance:  Right Eye Near:   Left Eye Near:    Bilateral Near:     Physical Exam Constitutional:      General: She is not in acute distress.    Appearance: Normal appearance. She is not toxic-appearing or diaphoretic.  HENT:     Head: Normocephalic and atraumatic.     Right Ear: Tympanic membrane and ear canal normal.     Left Ear: Tympanic membrane and ear canal normal.     Nose: Congestion present.     Mouth/Throat:     Mouth: Mucous membranes are moist.     Pharynx: Posterior oropharyngeal erythema present.  Eyes:     Extraocular Movements: Extraocular movements intact.     Conjunctiva/sclera: Conjunctivae normal.     Pupils: Pupils are equal, round, and reactive to light.  Cardiovascular:     Rate and Rhythm: Regular rhythm. Tachycardia present.     Pulses: Normal pulses.     Heart sounds: Normal heart sounds.  Pulmonary:     Effort: Pulmonary effort is normal. No respiratory distress.     Breath sounds: Normal breath sounds. No stridor. No wheezing, rhonchi or rales.  Musculoskeletal:        General: Normal range of motion.     Cervical  back: Normal range of motion.  Skin:    General: Skin is warm and dry.  Neurological:     General: No focal deficit present.     Mental Status: She is alert and oriented to person, place, and time. Mental status is at baseline.  Psychiatric:        Mood and Affect: Mood normal.        Behavior: Behavior normal.      UC Treatments / Results  Labs (all labs ordered are listed, but only abnormal results are displayed) Labs Reviewed - No data to display  EKG   Radiology No results found.  Procedures Procedures (including critical care time)  Medications Ordered in UC Medications - No data to display  Initial Impression / Assessment and Plan / UC Course  I have reviewed the triage vital signs and the nursing notes.  Pertinent labs & imaging results that were available during my care of the patient were reviewed by me and considered in my medical decision making (see chart for details).     Suspect symptoms started off as a viral illness but I am now concerned for asthma exacerbation versus community-acquired pneumonia.  I do have a concern given abnormal vital signs.  Patient reports that they are anxious but heart rate is very elevated and this does not appear to be close to baseline.  Therefore, discussed with patient I do recommend ER evaluation for STAT blood work and chest x-ray but can do limited workup here at urgent care if they wish.  Patient was agreeable to ER evaluation and left via family member transporting them.  Final Clinical Impressions(s) / UC Diagnoses   Final diagnoses:  None   Discharge Instructions   None    ED Prescriptions   None    PDMP not reviewed this encounter.   Gustavus Bryant, [place] 09/30/23 [phone]

## 2023-09-30 NOTE — ED Provider Notes (Signed)
 [place] EMERGENCY DEPARTMENT AT [facility] Provider Note   CSN: [id number] Arrival date & time: 09/30/23  1908     History {Add pertinent medical, surgical, social history, OB history to HPI:1} Chief Complaint  Patient presents with   Fever    Diamond Fuentes is a 33 y.o. female.  33 year old female with a history of psoriasis who presents to the emergency department with cough, fever, congestion for 9 days.  Patient reports that for the past 9 days she has been having the above symptoms.  Said a productive cough.  Was feeling better until today when she had a fever of 101F and went to urgent care.  She was tachycardic and referred into the emergency department.  Denies any chest pain or shortness of breath.  No leg swelling.       Home Medications Prior to Admission medications   Medication Sig Start Date End Date Taking? Authorizing Provider  ADVAIR DISKUS 250-50 MCG/DOSE AEPB inhale 1 puff by mouth twice a day 02/12/11   Coralyn Helling, MD  HYDROcodone-acetaminophen (NORCO) 5-325 MG per tablet Take 1 tablet by mouth every 4 (four) hours as needed for moderate pain. 10/11/13   Excell Seltzer, Adrian Blackwater, PA-C  naproxen (NAPROSYN) 500 MG tablet Take 1 tablet (500 mg total) by mouth 2 (two) times daily. 10/11/13   Graylon Good, PA-C      Allergies    Patient has no known allergies.    Review of Systems   Review of Systems  Physical Exam Updated Vital Signs BP 124/84 (BP Location: Right Arm)   Pulse (!) 119   Temp 98.3 F (36.8 C) (Oral)   Resp 15   LMP 09/21/2023 (Exact Date)   SpO2 100%  Physical Exam Vitals and nursing note reviewed.  Constitutional:      General: She is not in acute distress.    Appearance: She is well-developed.  HENT:     Head: Normocephalic and atraumatic.     Right Ear: External ear normal.     Left Ear: External ear normal.     Nose: Nose normal.  Eyes:     Extraocular Movements: Extraocular movements intact.     Conjunctiva/sclera:  Conjunctivae normal.     Pupils: Pupils are equal, round, and reactive to light.  Cardiovascular:     Rate and Rhythm: Regular rhythm. Tachycardia present.     Heart sounds: No murmur heard.    Comments: Heart rate 105 when I was in the room Pulmonary:     Effort: Pulmonary effort is normal. No respiratory distress.     Breath sounds: Normal breath sounds.     Comments: Somewhat limited due to habitus Musculoskeletal:     Cervical back: Normal range of motion and neck supple.     Right lower leg: No edema.     Left lower leg: No edema.  Skin:    General: Skin is warm and dry.  Neurological:     Mental Status: She is alert and oriented to person, place, and time. Mental status is at baseline.  Psychiatric:        Mood and Affect: Mood normal.     ED Results / Procedures / Treatments   Labs (all labs ordered are listed, but only abnormal results are displayed) Labs Reviewed  BASIC METABOLIC PANEL WITH GFR - Abnormal; Notable for the following components:      Result Value   CO2 21 (*)    Glucose, Bld 107 (*)  All other components within normal limits  RESP PANEL BY RT-PCR (RSV, FLU A&B, COVID)  RVPGX2  CBC WITH DIFFERENTIAL/PLATELET    EKG EKG Interpretation Date/Time:  Tuesday September 30 2023 19:19:29 EDT Ventricular Rate:  134 PR Interval:  138 QRS Duration:  80 QT Interval:  286 QTC Calculation: 427 R Axis:   70  Text Interpretation: Sinus tachycardia T wave abnormality, consider inferolateral ischemia Abnormal ECG No previous ECGs available Confirmed by Vonita Moss [phone]) on 09/30/2023 7:45:28 PM  Radiology DG Chest 2 View Result Date: 09/30/2023 CLINICAL DATA:  Shortness of breath and cough EXAM: CHEST - 2 VIEW COMPARISON:  None Available. FINDINGS: The heart size and mediastinal contours are within normal limits. Both lungs are clear. The visualized skeletal structures are unremarkable. IMPRESSION: No active cardiopulmonary disease. Electronically Signed   By:  Minerva Fester M.D.   On: 09/30/2023 19:50    Procedures Procedures  {Document cardiac monitor, telemetry assessment procedure when appropriate:1}  Medications Ordered in ED Medications - No data to display  ED Course/ Medical Decision Making/ A&P   {   Click here for ABCD2, HEART and other calculatorsREFRESH Note before signing :1}                              Medical Decision Making Amount and/or Complexity of Data Reviewed Labs: ordered. Radiology: ordered.  Risk Prescription drug management.   ***  {Document critical care time when appropriate:1} {Document review of labs and clinical decision tools ie heart score, Chads2Vasc2 etc:1}  {Document your independent review of radiology images, and any outside records:1} {Document your discussion with family members, caretakers, and with consultants:1} {Document social determinants of health affecting pt's care:1} {Document your decision making why or why not admission, treatments were needed:1} Final Clinical Impression(s) / ED Diagnoses Final diagnoses:  None    Rx / DC Orders ED Discharge Orders     None

## 2023-09-30 NOTE — ED Triage Notes (Signed)
 States started with body aches, cough, congestion 9 days ago; states initially had a fever x 1 day, but now fever has returned today and gone up to 101. C/O dyspnea. States does not have a rescue inhaler. Has taken Dayquil.

## 2023-09-30 NOTE — Discharge Instructions (Signed)
 You were seen for your cough in the emergency department.   At home, please take the antibiotics in case you have a pneumonia.    Check your MyChart online for the results of any tests that had not resulted by the time you left the emergency department.   Return immediately to the emergency department if you experience any of the following: Difficulty breathing, or any other concerning symptoms.    Thank you for visiting our Emergency Department. It was a pleasure taking care of you today.

## 2023-09-30 NOTE — ED Triage Notes (Signed)
 Cough, congestion, headache, body ache x 9 days Fever 101 today Seen at [facility] and sent for eval due to tachycardia
